# Patient Record
Sex: Female | Born: 1980 | Race: Black or African American | Hispanic: No | Marital: Single
Health system: Southern US, Community
[De-identification: ages and names within clinical notes are randomized; demographics above are authoritative.]

---

## 2021-04-07 ENCOUNTER — Ambulatory Visit: Payer: BC Managed Care – PPO | Admitting: Podiatry

## 2021-04-12 ENCOUNTER — Other Ambulatory Visit: Payer: Self-pay

## 2021-04-12 ENCOUNTER — Ambulatory Visit (INDEPENDENT_AMBULATORY_CARE_PROVIDER_SITE_OTHER): Payer: BC Managed Care – PPO

## 2021-04-12 ENCOUNTER — Encounter: Payer: Self-pay | Admitting: Podiatry

## 2021-04-12 ENCOUNTER — Ambulatory Visit: Payer: Self-pay

## 2021-04-12 ENCOUNTER — Ambulatory Visit (INDEPENDENT_AMBULATORY_CARE_PROVIDER_SITE_OTHER): Payer: BC Managed Care – PPO | Admitting: Podiatry

## 2021-04-12 DIAGNOSIS — M722 Plantar fascial fibromatosis: Secondary | ICD-10-CM

## 2021-04-12 DIAGNOSIS — S90211S Contusion of right great toe with damage to nail, sequela: Secondary | ICD-10-CM

## 2021-04-12 NOTE — Patient Instructions (Signed)
Look for Voltaren gel at the pharmacy over the counter or online (also known as diclofenac 1% gel). Apply to the painful areas 3-4x daily with the supplied dosing card. Allow to dry for 10 minutes before going into socks/shoes   Plantar Fasciitis (Heel Spur Syndrome) with Rehab The plantar fascia is a fibrous, ligament-like, soft-tissue structure that spans the bottom of the foot. Plantar fasciitis is a condition that causes pain in the foot due to inflammation of the tissue. SYMPTOMS  Pain and tenderness on the underneath side of the foot. Pain that worsens with standing or walking. CAUSES  Plantar fasciitis is caused by irritation and injury to the plantar fascia on the underneath side of the foot. Common mechanisms of injury include: Direct trauma to bottom of the foot. Damage to a small nerve that runs under the foot where the main fascia attaches to the heel bone. Stress placed on the plantar fascia due to bone spurs. RISK INCREASES WITH:  Activities that place stress on the plantar fascia (running, jumping, pivoting, or cutting). Poor strength and flexibility. Improperly fitted shoes. Tight calf muscles. Flat feet. Failure to warm-up properly before activity. Obesity. PREVENTION Warm up and stretch properly before activity. Allow for adequate recovery between workouts. Maintain physical fitness: Strength, flexibility, and endurance. Cardiovascular fitness. Maintain a health body weight. Avoid stress on the plantar fascia. Wear properly fitted shoes, including arch supports for individuals who have flat feet.  PROGNOSIS  If treated properly, then the symptoms of plantar fasciitis usually resolve without surgery. However, occasionally surgery is necessary.  RELATED COMPLICATIONS  Recurrent symptoms that may result in a chronic condition. Problems of the lower back that are caused by compensating for the injury, such as limping. Pain or weakness of the foot during push-off  following surgery. Chronic inflammation, scarring, and partial or complete fascia tear, occurring more often from repeated injections.  TREATMENT  Treatment initially involves the use of ice and medication to help reduce pain and inflammation. The use of strengthening and stretching exercises may help reduce pain with activity, especially stretches of the Achilles tendon. These exercises may be performed at home or with a therapist. Your caregiver may recommend that you use heel cups of arch supports to help reduce stress on the plantar fascia. Occasionally, corticosteroid injections are given to reduce inflammation. If symptoms persist for greater than 6 months despite non-surgical (conservative), then surgery may be recommended.   MEDICATION  If pain medication is necessary, then nonsteroidal anti-inflammatory medications, such as aspirin and ibuprofen, or other minor pain relievers, such as acetaminophen, are often recommended. Do not take pain medication within 7 days before surgery. Prescription pain relievers may be given if deemed necessary by your caregiver. Use only as directed and only as much as you need. Corticosteroid injections may be given by your caregiver. These injections should be reserved for the most serious cases, because they may only be given a certain number of times.  HEAT AND COLD Cold treatment (icing) relieves pain and reduces inflammation. Cold treatment should be applied for 10 to 15 minutes every 2 to 3 hours for inflammation and pain and immediately after any activity that aggravates your symptoms. Use ice packs or massage the area with a piece of ice (ice massage). Heat treatment may be used prior to performing the stretching and strengthening activities prescribed by your caregiver, physical therapist, or athletic trainer. Use a heat pack or soak the injury in warm water.  SEEK IMMEDIATE MEDICAL CARE IF: Treatment seems   to offer no benefit, or the condition  worsens. Any medications produce adverse side effects.  EXERCISES- RANGE OF MOTION (ROM) AND STRETCHING EXERCISES - Plantar Fasciitis (Heel Spur Syndrome) These exercises may help you when beginning to rehabilitate your injury. Your symptoms may resolve with or without further involvement from your physician, physical therapist or athletic trainer. While completing these exercises, remember:  Restoring tissue flexibility helps normal motion to return to the joints. This allows healthier, less painful movement and activity. An effective stretch should be held for at least 30 seconds. A stretch should never be painful. You should only feel a gentle lengthening or release in the stretched tissue.  RANGE OF MOTION - Toe Extension, Flexion Sit with your right / left leg crossed over your opposite knee. Grasp your toes and gently pull them back toward the top of your foot. You should feel a stretch on the bottom of your toes and/or foot. Hold this stretch for 10 seconds. Now, gently pull your toes toward the bottom of your foot. You should feel a stretch on the top of your toes and or foot. Hold this stretch for 10 seconds. Repeat  times. Complete this stretch 3 times per day.   RANGE OF MOTION - Ankle Dorsiflexion, Active Assisted Remove shoes and sit on a chair that is preferably not on a carpeted surface. Place right / left foot under knee. Extend your opposite leg for support. Keeping your heel down, slide your right / left foot back toward the chair until you feel a stretch at your ankle or calf. If you do not feel a stretch, slide your bottom forward to the edge of the chair, while still keeping your heel down. Hold this stretch for 10 seconds. Repeat 3 times. Complete this stretch 2 times per day.   STRETCH  Gastroc, Standing Place hands on wall. Extend right / left leg, keeping the front knee somewhat bent. Slightly point your toes inward on your back foot. Keeping your right / left  heel on the floor and your knee straight, shift your weight toward the wall, not allowing your back to arch. You should feel a gentle stretch in the right / left calf. Hold this position for 10 seconds. Repeat 3 times. Complete this stretch 2 times per day.  STRETCH  Soleus, Standing Place hands on wall. Extend right / left leg, keeping the other knee somewhat bent. Slightly point your toes inward on your back foot. Keep your right / left heel on the floor, bend your back knee, and slightly shift your weight over the back leg so that you feel a gentle stretch deep in your back calf. Hold this position for 10 seconds. Repeat 3 times. Complete this stretch 2 times per day.  STRETCH  Gastrocsoleus, Standing  Note: This exercise can place a lot of stress on your foot and ankle. Please complete this exercise only if specifically instructed by your caregiver.  Place the ball of your right / left foot on a step, keeping your other foot firmly on the same step. Hold on to the wall or a rail for balance. Slowly lift your other foot, allowing your body weight to press your heel down over the edge of the step. You should feel a stretch in your right / left calf. Hold this position for 10 seconds. Repeat this exercise with a slight bend in your right / left knee. Repeat 3 times. Complete this stretch 2 times per day.   STRENGTHENING EXERCISES - Plantar   Fasciitis (Heel Spur Syndrome)  These exercises may help you when beginning to rehabilitate your injury. They may resolve your symptoms with or without further involvement from your physician, physical therapist or athletic trainer. While completing these exercises, remember:  Muscles can gain both the endurance and the strength needed for everyday activities through controlled exercises. Complete these exercises as instructed by your physician, physical therapist or athletic trainer. Progress the resistance and repetitions only as guided.  STRENGTH -  Towel Curls Sit in a chair positioned on a non-carpeted surface. Place your foot on a towel, keeping your heel on the floor. Pull the towel toward your heel by only curling your toes. Keep your heel on the floor. Repeat 3 times. Complete this exercise 2 times per day.  STRENGTH - Ankle Inversion Secure one end of a rubber exercise band/tubing to a fixed object (table, pole). Loop the other end around your foot just before your toes. Place your fists between your knees. This will focus your strengthening at your ankle. Slowly, pull your big toe up and in, making sure the band/tubing is positioned to resist the entire motion. Hold this position for 10 seconds. Have your muscles resist the band/tubing as it slowly pulls your foot back to the starting position. Repeat 3 times. Complete this exercises 2 times per day.  Document Released: 09/19/2005 Document Revised: 12/12/2011 Document Reviewed: 01/01/2009 [employer] Patient Information 2014 [employer].  

## 2021-04-12 NOTE — Progress Notes (Signed)
  Subjective:  Patient ID: Nancy Zimmerman, female    DOB: Jun 08, 1981,  MRN: [id number]  Chief Complaint  Patient presents with   Foot Pain    NP - LEFT FOOT PAIN, heel pain x 1 month. Pt has had PF in the past    40 y.o. female presents with the above complaint. History confirmed with patient. Has a history of plantar fasciitis. She fell down 5-6 stairs a few weeks ago. New heel pain since that injury. Also dropped a can of beans on R hallux. Works at [employer].   Objective:  Physical Exam: warm, good capillary refill, no trophic changes or ulcerative lesions, normal DP and PT pulses, and normal sensory exam. Left Foot: point tenderness over the heel pad and point tenderness of the mid plantar fascia   Radiographs: Multiple views x-ray of the left foot: no fracture, dislocation, swelling or degenerative changes noted Assessment:   1. Plantar fasciitis of left foot   2. Contusion of right great toe with damage to nail, sequela      Plan:  Patient was evaluated and treated and all questions answered.  Discussed the etiology and treatment options for plantar fasciitis including stretching, formal physical therapy, supportive shoegears such as a running shoe or sneaker, pre fabricated orthoses, injection therapy, and oral medications. We also discussed the role of surgical treatment of this for patients who do not improve after exhausting non-surgical treatment options.   -XR reviewed with patient -Educated patient on stretching and icing of the affected limb -Plantar fascial brace dispensed -Recommended voltaren use - She can't take NSAIDs 2/2 severe GERD. I recommend injection next time if no better   Her R hallux nail is bruised but well attached. Will CTM, avulse if needed   No follow-ups on file.

## 2021-04-26 ENCOUNTER — Encounter: Payer: Self-pay | Admitting: Podiatry

## 2021-04-26 ENCOUNTER — Other Ambulatory Visit: Payer: Self-pay

## 2021-04-26 ENCOUNTER — Ambulatory Visit (INDEPENDENT_AMBULATORY_CARE_PROVIDER_SITE_OTHER): Payer: BC Managed Care – PPO | Admitting: Podiatry

## 2021-04-26 DIAGNOSIS — M722 Plantar fascial fibromatosis: Secondary | ICD-10-CM

## 2021-04-26 NOTE — Progress Notes (Signed)
  Subjective:  Patient ID: Nancy Zimmerman, female    DOB: Nov 02, 1980,  MRN: [id number]  Chief Complaint  Patient presents with   Plantar Fasciitis    Left foot still hurts    40 y.o. female returns with the above complaint. History confirmed with patient.  She is having quite a bit of pain and would like an injection  Objective:  Physical Exam: warm, good capillary refill, no trophic changes or ulcerative lesions, normal DP and PT pulses, and normal sensory exam. Left Foot: point tenderness over the heel pad and point tenderness of the mid plantar fascia   Radiographs: Multiple views x-ray of the left foot: no fracture, dislocation, swelling or degenerative changes noted Assessment:   1. Plantar fasciitis of left foot      Plan:  Patient was evaluated and treated and all questions answered.  Discussed the etiology and treatment options for plantar fasciitis including stretching, formal physical therapy, supportive shoegears such as a running shoe or sneaker, pre fabricated orthoses, injection therapy, and oral medications. We also discussed the role of surgical treatment of this for patients who do not improve after exhausting non-surgical treatment options.   Continue plantar fascial brace over her socks it was irritating the front of the ankle joint and rubbing We also discussed a prednisone taper but she would like to avoid this.  After sterile prep with povidone-iodine solution and alcohol, the left heel was injected with 0.5cc 2% xylocaine plain, 0.5cc 0.5% marcaine plain, 5mg  triamcinolone acetonide, and 2mg  dexamethasone was injected along the medial plantar fascia at the insertion on the plantar calcaneus. The patient tolerated the procedure well without complication.    Return in about 4 weeks (around 05/24/2021) for recheck plantar fasciitis.

## 2021-05-05 ENCOUNTER — Other Ambulatory Visit: Payer: Self-pay

## 2021-05-05 ENCOUNTER — Ambulatory Visit (INDEPENDENT_AMBULATORY_CARE_PROVIDER_SITE_OTHER): Payer: BC Managed Care – PPO | Admitting: Podiatry

## 2021-05-05 ENCOUNTER — Telehealth: Payer: Self-pay

## 2021-05-05 DIAGNOSIS — M722 Plantar fascial fibromatosis: Secondary | ICD-10-CM

## 2021-05-05 NOTE — Telephone Encounter (Signed)
Demographics and orders faxed to [facility] Evaluate and treat  M72.2 left and M76.60 left 2 to 3 visits per week for 6 to  8 weeks

## 2021-05-09 ENCOUNTER — Encounter: Payer: Self-pay | Admitting: Podiatry

## 2021-05-09 NOTE — Progress Notes (Signed)
  Subjective:  Patient ID: Nancy Zimmerman, female    DOB: Dec 03, 1980,  MRN: [id number]  Chief Complaint  Patient presents with   Plantar Fasciitis    Follow up left foot - still painful    40 y.o. female returns with the above complaint. History confirmed with patient.  Still has not improved at all Objective:  Physical Exam: warm, good capillary refill, no trophic changes or ulcerative lesions, normal DP and PT pulses, and normal sensory exam. Left Foot: point tenderness over the heel pad and point tenderness of the mid plantar fascia   Radiographs: Multiple views x-ray of the left foot: no fracture, dislocation, swelling or degenerative changes noted Assessment:   1. Plantar fasciitis of left foot       Plan:  Patient was evaluated and treated and all questions answered.  Discussed the etiology and treatment options for plantar fasciitis including stretching, formal physical therapy, supportive shoegears such as a running shoe or sneaker, pre fabricated orthoses, injection therapy, and oral medications. We also discussed the role of surgical treatment of this for patients who do not improve after exhausting non-surgical treatment options.  Still has had not much improvement and she is having difficulty walking.  I recommended increased immobilization in a cam boot which was dispensed today.  Discussed with her that this may take quite some time to improve completely.  I also recommend physical therapy to work with this.  [facility] sent to [facility] in [place]    No follow-ups on file.

## 2021-05-10 ENCOUNTER — Ambulatory Visit: Payer: BC Managed Care – PPO | Admitting: Podiatry

## 2021-05-12 ENCOUNTER — Ambulatory Visit: Payer: BC Managed Care – PPO | Admitting: Podiatry

## 2021-05-24 ENCOUNTER — Ambulatory Visit: Payer: BC Managed Care – PPO | Admitting: Podiatry

## 2021-06-02 ENCOUNTER — Ambulatory Visit (INDEPENDENT_AMBULATORY_CARE_PROVIDER_SITE_OTHER): Payer: BC Managed Care – PPO | Admitting: Podiatry

## 2021-06-02 ENCOUNTER — Other Ambulatory Visit: Payer: Self-pay

## 2021-06-02 ENCOUNTER — Encounter: Payer: Self-pay | Admitting: Podiatry

## 2021-06-02 DIAGNOSIS — M722 Plantar fascial fibromatosis: Secondary | ICD-10-CM | POA: Diagnosis not present

## 2021-06-02 DIAGNOSIS — M79672 Pain in left foot: Secondary | ICD-10-CM

## 2021-06-02 NOTE — Progress Notes (Signed)
  Subjective:  Patient ID: Nancy Zimmerman, female    DOB: 01-04-1981,  MRN: [id number]  Chief Complaint  Patient presents with   Plantar Fasciitis    Left foot - 4 week follow up    40 y.o. female returns with the above complaint. History confirmed with patient.  Still has not improved at all Objective:  Physical Exam: warm, good capillary refill, no trophic changes or ulcerative lesions, normal DP and PT pulses, and normal sensory exam. Left Foot: point tenderness over the heel pad and point tenderness of the mid plantar fascia   Radiographs: Multiple views x-ray of the left foot: no fracture, dislocation, swelling or degenerative changes noted Assessment:   1. Left foot pain   2. Plantar fasciitis of left foot       Plan:  Patient was evaluated and treated and all questions answered.  Discussed the etiology and treatment options for plantar fasciitis including stretching, formal physical therapy, supportive shoegears such as a running shoe or sneaker, pre fabricated orthoses, injection therapy, and oral medications. We also discussed the role of surgical treatment of this for patients who do not improve after exhausting non-surgical treatment options.  This point she has not improved at all despite CAM boot immobilization and physical therapy and several weeks of treatment.  I recommended MRI to evaluate the plantar fascia and heel to guide further treatment options including possible surgical intervention.  She will follow-up after the MRI.    Return in about 1 month (around 07/02/2021) for recheck plantar fasciitis, after MRI to review.

## 2021-06-19 ENCOUNTER — Ambulatory Visit
Admission: RE | Admit: 2021-06-19 | Discharge: 2021-06-19 | Disposition: A | Discharge status: Home / Self Care | Payer: BC Managed Care – PPO | Source: Ambulatory Visit | Attending: Podiatry | Admitting: Podiatry

## 2021-06-19 DIAGNOSIS — M722 Plantar fascial fibromatosis: Secondary | ICD-10-CM

## 2021-07-05 ENCOUNTER — Other Ambulatory Visit: Payer: Self-pay

## 2021-07-05 ENCOUNTER — Encounter: Payer: Self-pay | Admitting: Podiatry

## 2021-07-05 ENCOUNTER — Ambulatory Visit (INDEPENDENT_AMBULATORY_CARE_PROVIDER_SITE_OTHER): Payer: BC Managed Care – PPO | Admitting: Podiatry

## 2021-07-05 DIAGNOSIS — M722 Plantar fascial fibromatosis: Secondary | ICD-10-CM

## 2021-07-05 NOTE — Progress Notes (Signed)
Subjective:  Patient ID: Nancy Zimmerman, female    DOB: 1981-07-04,  MRN: [id number]  Chief Complaint  Patient presents with   Plantar Fasciitis     4 week recheck plantar fasciitis left.    40 y.o. female returns with the above complaint. History confirmed with patient.  She completed the MRI, she thinks the boot is starting to hurt more and her pain is getting worse on the outside of the ankle Objective:  Physical Exam: warm, good capillary refill, no trophic changes or ulcerative lesions, normal DP and PT pulses, and normal sensory exam. Left Foot: point tenderness over the heel pad and point tenderness of the mid plantar fascia, mild pain on the peroneal tendons   Radiographs: Multiple views x-ray of the left foot: no fracture, dislocation, swelling or degenerative changes noted  Study Result  Narrative & Impression  CLINICAL DATA:  Foot pain, chronic, plantar fasciitis suspected   EXAM: MR OF THE LEFT HEEL WITHOUT CONTRAST   TECHNIQUE: Multiplanar, multisequence MR imaging of the heel was performed. No intravenous contrast was administered.   COMPARISON:  X-ray 04/12/2021   FINDINGS: TENDONS   Peroneal: Mild tendinosis of the peroneus longus tendon. Peroneus brevis tendon intact. Mild tenosynovitis of the peroneal tendons.   Posteromedial: Intact tibialis posterior, flexor hallucis longus and flexor digitorum longus tendons. Trace tenosynovial fluid associated with the posterior tibialis and flexor hallucis longus tendons.   Anterior: Intact tibialis anterior, extensor hallucis longus and extensor digitorum longus tendons.   Achilles: Intact.  No retrocalcaneal bursal fluid.   Plantar Fascia: Mild thickening of the central band of the plantar fascia with tiny partial-thickness interstitial tear. Mild perifascial edema. Reactive subcortical marrow edema within the plantar aspect of the calcaneus.   LIGAMENTS   Lateral: The anterior and posterior tibiofibular  ligaments are intact. The anterior and posterior talofibular ligaments are intact. Intact calcaneofibular ligament.   Medial: Deltoid ligament and spring ligament complex intact.   CARTILAGE   Ankle Joint: No joint effusion or chondral defect.   Subtalar Joints/Sinus Tarsi: No joint effusion or chondral defect. Preservation of the anatomic fat within the sinus tarsi.   Bones: Small plantar calcaneal spur with reactive subcortical marrow edema. No fracture. No malalignment. No suspicious bone lesions.   Other: Subcutaneous edema overlies the lateral malleolus. No fluid collections.   IMPRESSION: 1. Plantar fasciitis with tiny partial-thickness interstitial tear of the central band. 2. Mild peroneus longus tendinosis. 3. Mild tenosynovitis of the peroneal tendons, posterior tibialis tendon, and the flexor hallucis longus tendon.     Electronically Signed   By: Duanne Guess D.O.   On: 06/20/2021 11:49   Assessment:   1. Plantar fasciitis of left foot       Plan:  Patient was evaluated and treated and all questions answered.  Discussed the etiology and treatment options for plantar fasciitis including stretching, formal physical therapy, supportive shoegears such as a running shoe or sneaker, pre fabricated orthoses, injection therapy, and oral medications. We also discussed the role of surgical treatment of this for patients who do not improve after exhausting non-surgical treatment options.  -I like her to continue physical therapy to try dry needling.  Otherwise she can discontinue it at this point if she can continue her home exercises at home -Would still keep her on restrictions of the amount of hours she is working due to the type of her job -We discussed the MRI results and discussed surgical intervention if this does not improve.  I  recommended custom molded orthoses to see if this will help [place] avoid surgery.  She was casted for these today with a longitudinal  flexible arch support.  She had these previously that were very rigid and were not very comfortable.  This will have a horseshoe heel pad to offload the central heel and plantar fascia.  If this does not improve we will plan for surgery in December for a plantar fascial release and heel spur resection -We also discussed treating this with an anti-inflammatory.  Her gastric reflux prevents her from taking most NSAIDs.  I like to trial Celebrex to see if this helps her without flaring her gastric reflux.    No follow-ups on file.

## 2021-07-07 ENCOUNTER — Telehealth: Payer: Self-pay | Admitting: *Deleted

## 2021-07-07 MED ORDER — CELECOXIB 100 MG PO CAPS: 100.0000 mg | ORAL_CAPSULE | Freq: Two times a day (BID) | ORAL | 2 refills | Status: DC

## 2021-07-07 NOTE — Telephone Encounter (Signed)
"  I was supposed to have Celebrex sent over on Monday to [facility] on [address]  I saw Dr. Lilian Kapur on Monday.  Can someone send it over because my foot is swelling?"

## 2021-07-07 NOTE — Addendum Note (Signed)
Addended byLilian Kapur, Gene Colee R on: 07/07/2021 03:35 PM   Modules accepted: Orders

## 2021-08-02 ENCOUNTER — Telehealth: Payer: Self-pay | Admitting: *Deleted

## 2021-08-02 NOTE — Telephone Encounter (Signed)
"  I started taking Celebrex a couple of weeks ago.  Now I have, starting like yesterday maybe sometime in the last week, bruises on my legs, on the front of my shins.  I haven't run into anything.  So, I'm wondering if can get another prescription prescribed.  Also, I want to know if my insoles are available to be picked up."

## 2021-08-03 ENCOUNTER — Telehealth: Payer: Self-pay | Admitting: Podiatry

## 2021-08-03 MED ORDER — METHYLPREDNISOLONE 4 MG PO TBPK: ORAL_TABLET | ORAL | 0 refills | Status: AC

## 2021-08-03 NOTE — Telephone Encounter (Signed)
Pt sent message to Dr Lilian Kapur asking about her orthotics if they were in.Marland Kitchen  Upon checking they are in and I called pt and left message they will be in [place] office as of tomorrow 11.2 and it would be ok to pick them up tomorrow per Dr Lilian Kapur.

## 2021-08-03 NOTE — Addendum Note (Signed)
Addended byLilian Kapur, Sahian Kerney R on: 08/03/2021 09:21 AM   Modules accepted: Orders

## 2021-08-25 ENCOUNTER — Ambulatory Visit: Payer: BC Managed Care – PPO | Admitting: Podiatry

## 2021-09-17 ENCOUNTER — Ambulatory Visit: Payer: BC Managed Care – PPO

## 2021-09-17 ENCOUNTER — Other Ambulatory Visit: Payer: Self-pay

## 2021-09-17 DIAGNOSIS — M722 Plantar fascial fibromatosis: Secondary | ICD-10-CM

## 2021-09-17 NOTE — Progress Notes (Signed)
SITUATION: Reason for Visit: Fitting and Delivery of Custom Fabricated Foot Orthoses Patient Report: Patient reports comfort and is satisfied with device.  OBJECTIVE DATA: Patient History / Diagnosis:  No change in pathology Provided Device:  Custom functional foot orthotics  GOAL OF ORTHOSIS - Improve gait - Decrease energy expenditure - Improve Balance - Provide Triplanar stability of foot complex - Facilitate motion  ACTIONS PERFORMED Patient was fit with foot orthotics trimmed to shoe last. Patient tolerated fittign procedure. Device was modified as follows to better fit patient: - Toe plate was trimmed to shoe last  Patient was provided with verbal and written instruction and demonstration regarding donning, doffing, wear, care, proper fit, function, purpose, cleaning, and use of the orthosis and in all related precautions and risks and benefits regarding the orthosis.  Patient was also provided with verbal instruction regarding how to report any failures or malfunctions of the orthosis and necessary follow up care. Patient was also instructed to contact our office regarding any change in status that may affect the function of the orthosis.  Patient demonstrated independence with proper donning, doffing, and fit and verbalized understanding of all instructions.  PLAN: Patient is to follow up in one week or as necessary (PRN). All questions were answered and concerns addressed. Plan of care was discussed with and agreed upon by the patient.  

## 2022-01-27 ENCOUNTER — Other Ambulatory Visit: Payer: Self-pay | Admitting: Gastroenterology

## 2022-01-27 DIAGNOSIS — R109 Unspecified abdominal pain: Secondary | ICD-10-CM

## 2022-01-27 DIAGNOSIS — Z8719 Personal history of other diseases of the digestive system: Secondary | ICD-10-CM

## 2022-02-09 ENCOUNTER — Other Ambulatory Visit: Payer: Self-pay | Admitting: Internal Medicine

## 2022-02-09 DIAGNOSIS — Z1231 Encounter for screening mammogram for malignant neoplasm of breast: Secondary | ICD-10-CM

## 2022-02-21 ENCOUNTER — Other Ambulatory Visit: Payer: BC Managed Care – PPO

## 2022-03-08 ENCOUNTER — Ambulatory Visit
Admission: RE | Admit: 2022-03-08 | Discharge: 2022-03-08 | Disposition: A | Discharge status: Home / Self Care | Payer: BC Managed Care – PPO | Source: Ambulatory Visit | Attending: Gastroenterology | Admitting: Gastroenterology

## 2022-03-08 DIAGNOSIS — R109 Unspecified abdominal pain: Secondary | ICD-10-CM

## 2022-03-08 DIAGNOSIS — Z8719 Personal history of other diseases of the digestive system: Secondary | ICD-10-CM

## 2022-03-08 IMAGING — CT CT ABD-PELV W/ CM
1 of 3 series · 13 of 32 positions shown, 19 images · IV contrast (agent unspecified)
Comparison: None Available.

CLINICAL DATA: Abdominal pain, status post umbilical hernia repair

EXAM:
CT ABDOMEN AND PELVIS WITH CONTRAST
TECHNIQUE: Multidetector CT imaging of the abdomen and pelvis was performed
using the standard protocol following bolus administration of
intravenous contrast.

[Series 2: abd/pelvis w/cm · axial · 0.75mm/px · z∈[+765,+1140]mm · 13 of 89 slices shown, 19 images]
[im 7/89  soft-tissue]
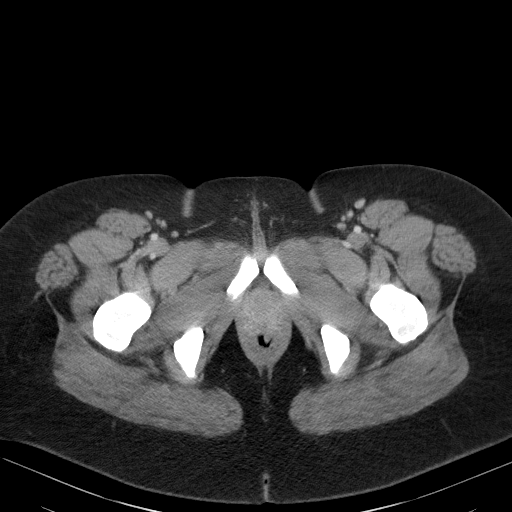
[im 7/89  bone]
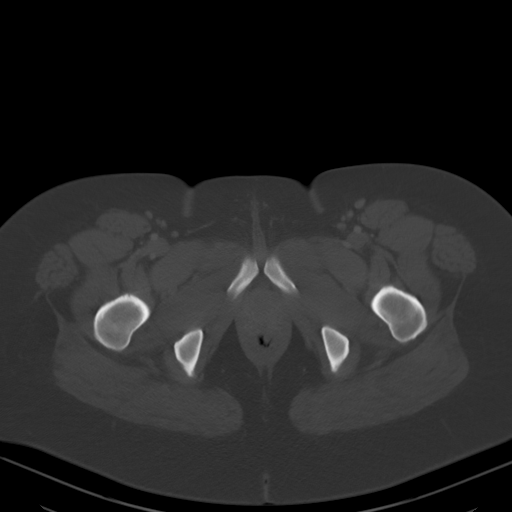
[im 13/89  soft-tissue]
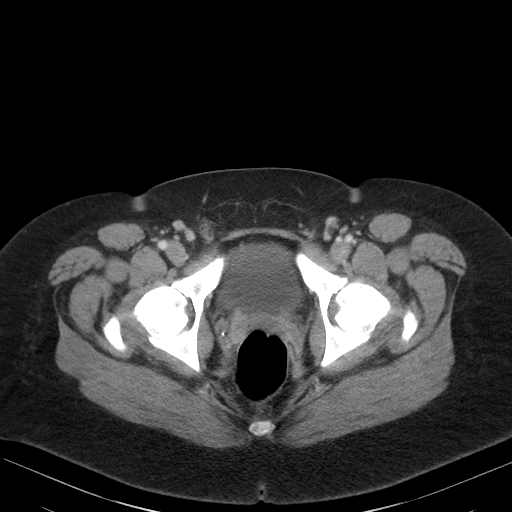
[im 19/89  soft-tissue]
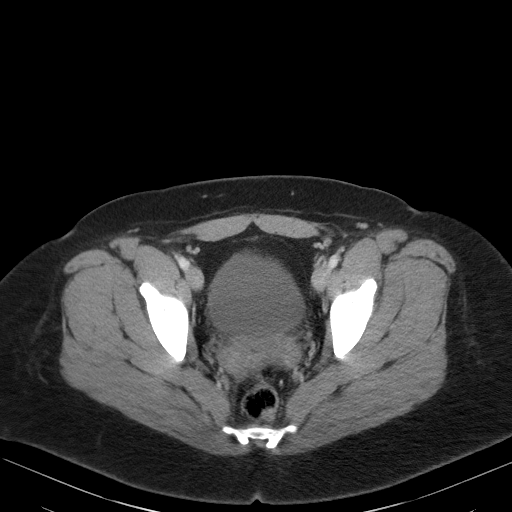
[im 26/89  soft-tissue]
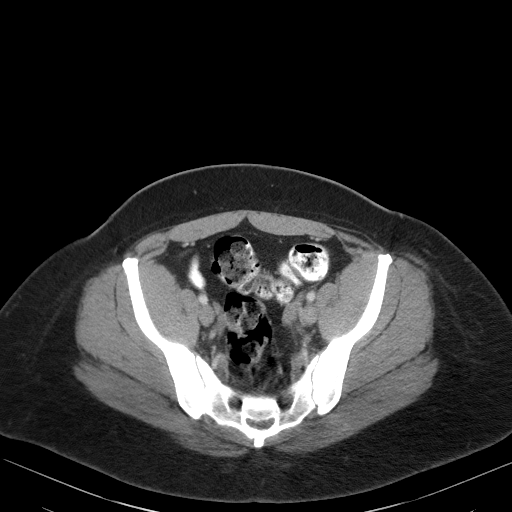
[im 32/89  soft-tissue]
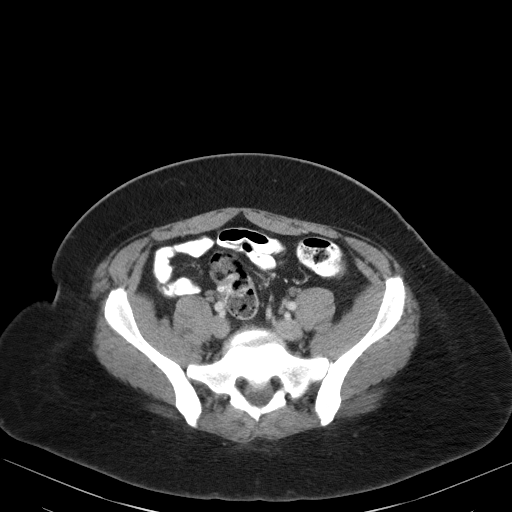
[im 38/89  soft-tissue]
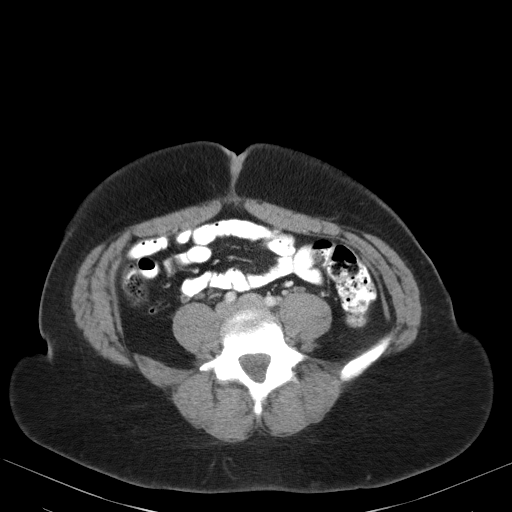
[im 45/89  soft-tissue]
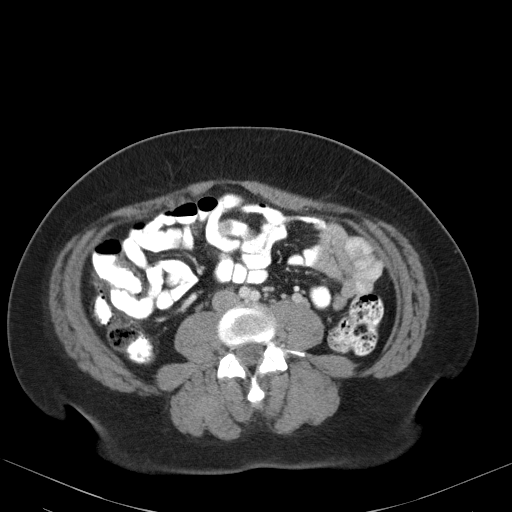
[im 51/89  soft-tissue]
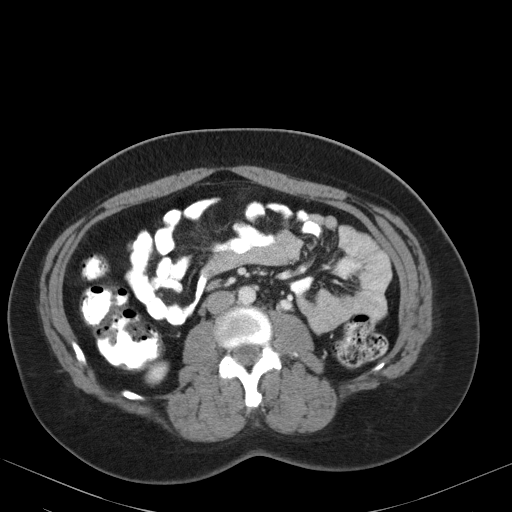
[im 57/89  soft-tissue]
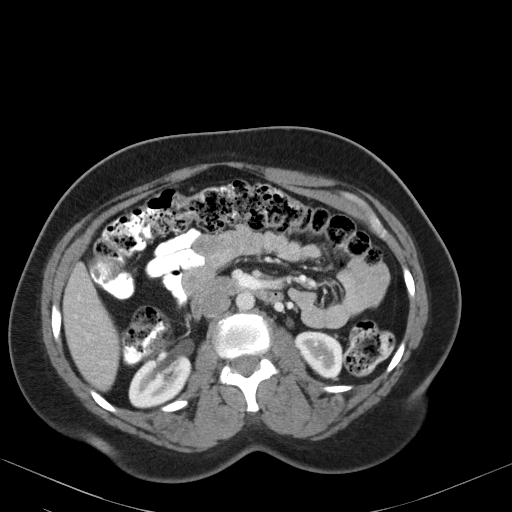
[im 57/89  bone]
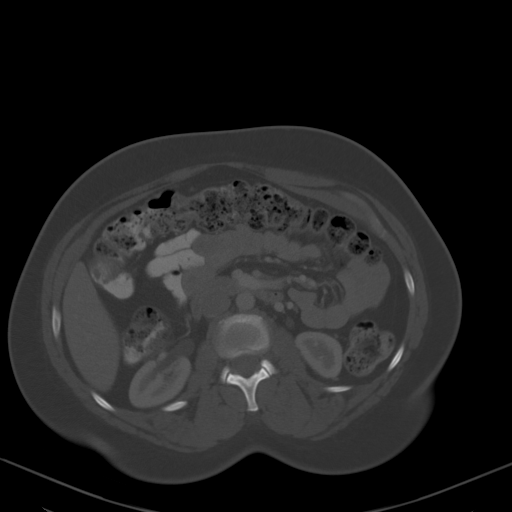
[im 63/89  soft-tissue]
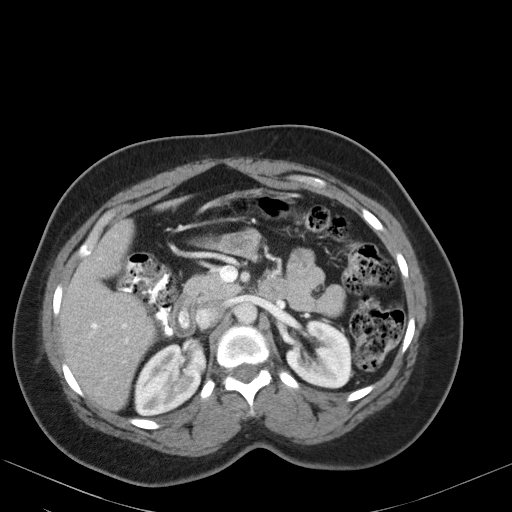
[im 63/89  lung]
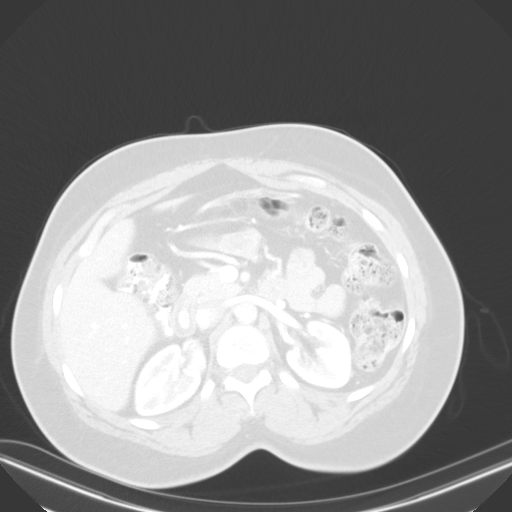
[im 70/89  soft-tissue]
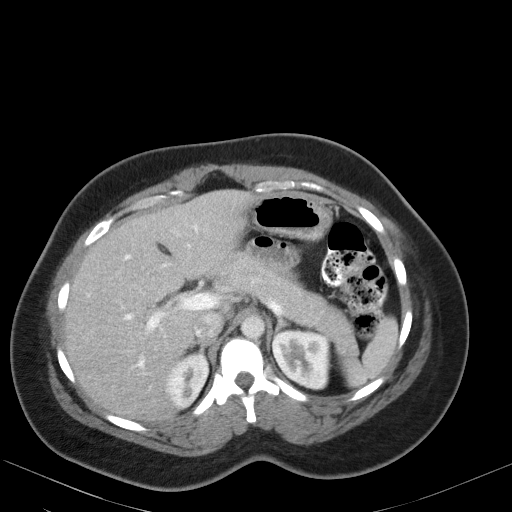
[im 70/89  lung]
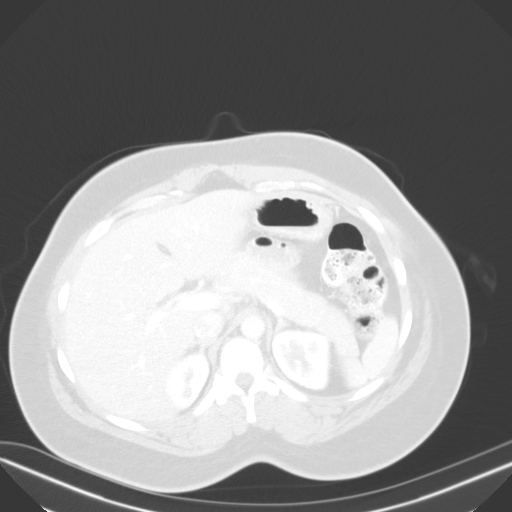
[im 76/89  soft-tissue]
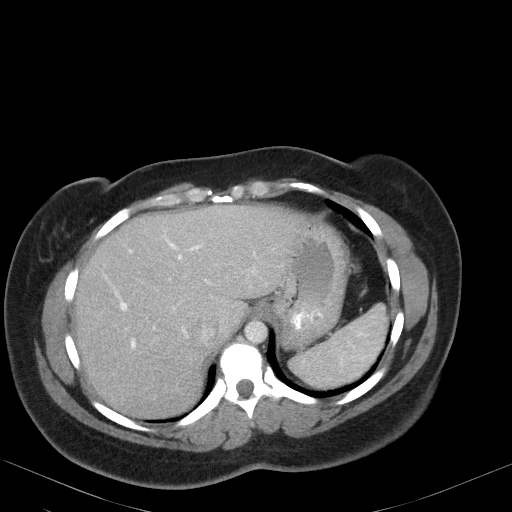
[im 76/89  lung]
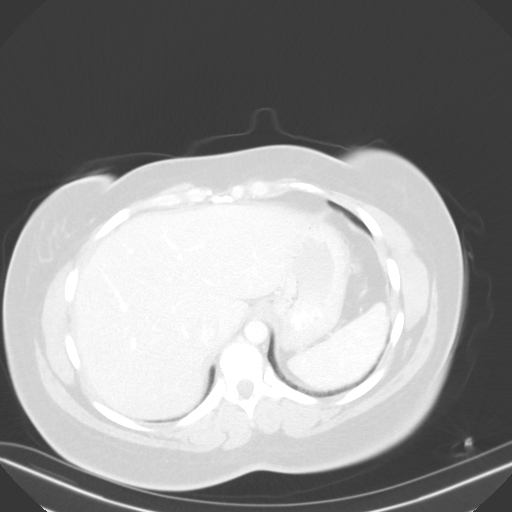
[im 82/89  soft-tissue]
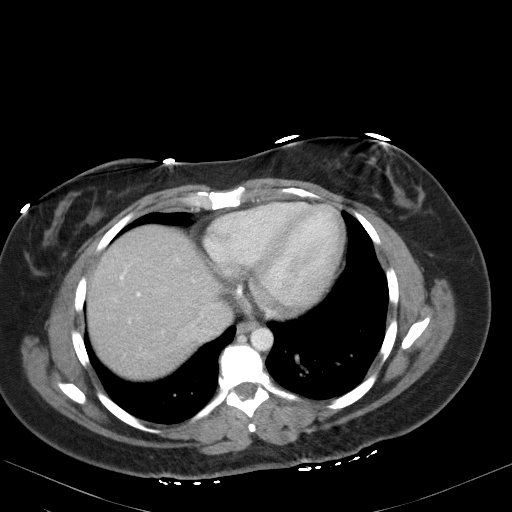
[im 82/89  lung]
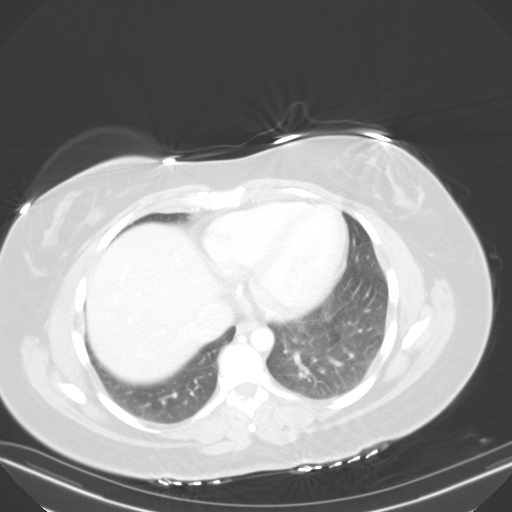

[13 of 32 positions shown; findings below may reference images not displayed]

RADIATION DOSE REDUCTION: This exam was performed according to the
departmental dose-optimization program which includes automated
exposure control, adjustment of the mA and/or kV according to
patient size and/or use of iterative reconstruction technique.

CONTRAST:  100mL [4J] IOPAMIDOL ([4J]) INJECTION 61%
FINDINGS: Lower chest: No acute abnormality.

Hepatobiliary: Liver is upper normal size with normal contour. No
suspicious hepatic mass identified. Gallbladder is surgically
absent. No biliary ductal dilatation.

Pancreas: Unremarkable. No pancreatic ductal dilatation or
surrounding inflammatory changes.

Spleen: Normal in size without focal abnormality.

Adrenals/Urinary Tract: Adrenal glands are unremarkable. Kidneys are
normal, without renal calculi, focal lesion, or hydronephrosis.
Bladder is unremarkable.

Stomach/Bowel: No bowel obstruction, free air or pneumatosis. No
bowel wall edema identified. Moderate amount of retained fecal
material throughout the colon. Appendix is normal.

Vascular/Lymphatic: No significant vascular findings are present. No
enlarged abdominal or pelvic lymph nodes.

Reproductive: Status post hysterectomy. No adnexal masses.

Other: No ascites. No recurrent umbilical hernia visualized. No
acute abnormality in the abdominal wall.

Musculoskeletal: Degenerative changes in the lumbar spine. No
suspicious bony lesions identified.
IMPRESSION: No acute process identified.

## 2022-03-08 MED ORDER — IOPAMIDOL (ISOVUE-300) INJECTION 61%
100.0000 mL | Freq: Once | INTRAVENOUS | Status: AC | PRN
  Administered 2022-03-08: 100 mL via INTRAVENOUS

## 2022-08-01 ENCOUNTER — Ambulatory Visit
Admission: RE | Admit: 2022-08-01 | Discharge: 2022-08-01 | Disposition: A | Discharge status: Home / Self Care | Payer: BC Managed Care – PPO | Source: Ambulatory Visit | Attending: Internal Medicine | Admitting: Internal Medicine

## 2022-08-01 DIAGNOSIS — Z1231 Encounter for screening mammogram for malignant neoplasm of breast: Secondary | ICD-10-CM
